# Patient Record
Sex: Male | Born: 1965 | Race: White | Hispanic: No | Marital: Single | Smoking: Current every day smoker
Health system: Southern US, Community
[De-identification: ages and names within clinical notes are randomized; demographics above are authoritative.]

## PROBLEM LIST (undated history)

## (undated) DIAGNOSIS — K469 Unspecified abdominal hernia without obstruction or gangrene: Secondary | ICD-10-CM

## (undated) DIAGNOSIS — K279 Peptic ulcer, site unspecified, unspecified as acute or chronic, without hemorrhage or perforation: Secondary | ICD-10-CM

---

## 2009-09-05 ENCOUNTER — Emergency Department (HOSPITAL_COMMUNITY): Admission: EM | Admit: 2009-09-05 | Discharge: 2009-09-05 | Payer: Self-pay | Admitting: Emergency Medicine

## 2011-07-08 ENCOUNTER — Emergency Department (HOSPITAL_COMMUNITY): Payer: Self-pay

## 2011-07-08 ENCOUNTER — Emergency Department (HOSPITAL_COMMUNITY)
Admission: EM | Admit: 2011-07-08 | Discharge: 2011-07-09 | Disposition: A | Discharge status: Home / Self Care | Payer: Self-pay | Attending: Emergency Medicine | Admitting: Emergency Medicine

## 2011-07-08 DIAGNOSIS — F17200 Nicotine dependence, unspecified, uncomplicated: Secondary | ICD-10-CM | POA: Insufficient documentation

## 2011-07-08 DIAGNOSIS — R110 Nausea: Secondary | ICD-10-CM | POA: Insufficient documentation

## 2011-07-08 DIAGNOSIS — R8281 Pyuria: Secondary | ICD-10-CM

## 2011-07-08 DIAGNOSIS — R82998 Other abnormal findings in urine: Secondary | ICD-10-CM | POA: Insufficient documentation

## 2011-07-08 DIAGNOSIS — R109 Unspecified abdominal pain: Secondary | ICD-10-CM | POA: Insufficient documentation

## 2011-07-08 DIAGNOSIS — R319 Hematuria, unspecified: Secondary | ICD-10-CM | POA: Insufficient documentation

## 2011-07-08 DIAGNOSIS — R300 Dysuria: Secondary | ICD-10-CM | POA: Insufficient documentation

## 2011-07-08 HISTORY — DX: Unspecified abdominal hernia without obstruction or gangrene: K46.9

## 2011-07-08 HISTORY — DX: Peptic ulcer, site unspecified, unspecified as acute or chronic, without hemorrhage or perforation: K27.9

## 2011-07-08 LAB — URINE MICROSCOPIC-ADD ON

## 2011-07-08 LAB — URINALYSIS, ROUTINE W REFLEX MICROSCOPIC
Leukocytes, UA: NEGATIVE
Nitrite: NEGATIVE
Protein, ur: 30 mg/dL — AB

## 2011-07-08 LAB — BASIC METABOLIC PANEL
CO2: 28 mEq/L (ref 19–32)
Calcium: 9.3 mg/dL (ref 8.4–10.5)
GFR calc non Af Amer: >90 mL/min (ref >90)
Glucose, Bld: 120 mg/dL — ABNORMAL HIGH (ref 70–99)
Potassium: 3.3 mEq/L — ABNORMAL LOW (ref 3.5–5.1)
Sodium: 136 mEq/L (ref 135–145)

## 2011-07-08 MED ORDER — SODIUM CHLORIDE 0.9 % IV SOLN: INTRAVENOUS | Status: DC

## 2011-07-08 MED ORDER — ONDANSETRON HCL 4 MG/2ML IJ SOLN
4.0000 mg | Freq: Once | INTRAMUSCULAR | Status: AC
  Administered 2011-07-08: 4 mg via INTRAVENOUS
  Filled 2011-07-08: qty 2

## 2011-07-08 MED ORDER — HYDROMORPHONE HCL PF 1 MG/ML IJ SOLN
1.0000 mg | Freq: Once | INTRAMUSCULAR | Status: AC
  Administered 2011-07-08: 1 mg via INTRAVENOUS
  Filled 2011-07-08: qty 1

## 2011-07-08 NOTE — ED Notes (Signed)
Pt presents with bilateral flank pain, hematuria and pain with urination. Pt states he also has nausea when "sharp pains come".

## 2011-07-08 NOTE — ED Notes (Signed)
Pt states has had kidney stone in the past (5 yrs ago) and is same type pain as then.  Pt co of right flank pain that radiates into rt groin. Pt states has had blood in urine today. Unable to void at this time. Pt aware of need for specimen.

## 2011-07-09 MED ORDER — DOXYCYCLINE HYCLATE 100 MG PO TABS: 100.0000 mg | ORAL_TABLET | Freq: Once | ORAL | Status: DC

## 2011-07-09 MED ORDER — CIPROFLOXACIN HCL 250 MG PO TABS
500.0000 mg | ORAL_TABLET | Freq: Once | ORAL | Status: AC
  Administered 2011-07-09: 500 mg via ORAL
  Filled 2011-07-09: qty 2

## 2011-07-09 MED ORDER — OXYCODONE-ACETAMINOPHEN 5-325 MG PO TABS: 1.0000 | ORAL_TABLET | ORAL | Status: AC | PRN

## 2011-07-09 MED ORDER — CIPROFLOXACIN HCL 500 MG PO TABS: 500.0000 mg | ORAL_TABLET | Freq: Two times a day (BID) | ORAL | Status: AC

## 2011-07-09 MED ORDER — OXYCODONE-ACETAMINOPHEN 5-325 MG PO TABS
1.0000 | ORAL_TABLET | Freq: Once | ORAL | Status: AC
  Administered 2011-07-09: 1 via ORAL
  Filled 2011-07-09: qty 1

## 2011-07-09 NOTE — ED Notes (Signed)
Pt given discharge instructions, paperwork & prescription(s), pt verbalized understanding.   

## 2011-07-10 NOTE — ED Provider Notes (Signed)
History     CSN: [id number] Arrival date & time: 07/08/2011  8:36 PM   First MD Initiated Contact with Patient 07/08/11 2202      Chief Complaint  Patient presents with  . Flank Pain  . Hematuria  . Dysuria    (Consider location/radiation/quality/duration/timing/severity/associated sxs/prior treatment) Patient is a 45 y.o. male presenting with flank pain, hematuria, and dysuria. The history is provided by the patient and the spouse.  Flank Pain This is a new problem. The current episode started yesterday. The problem occurs intermittently. The problem has been unchanged. Associated symptoms include nausea. Pertinent negatives include no abdominal pain, arthralgias, chest pain, congestion, diaphoresis, fever, headaches, joint swelling, neck pain, numbness, rash, sore throat, vomiting or weakness. Associated symptoms comments: Bilateral flank pain and hematuria.  Denies penile discharge. . Exacerbated by: urination. He has tried acetaminophen and NSAIDs for the symptoms. The treatment provided no relief.  Hematuria Associated symptoms include dysuria, flank pain and nausea. Pertinent negatives include no abdominal pain, fever or vomiting.  Dysuria  Associated symptoms include nausea, hematuria and flank pain. Pertinent negatives include no vomiting.    Past Medical History  Diagnosis Date  . Hernia   . Peptic ulcer     History reviewed. No pertinent past surgical history.  No family history on file.  History  Substance Use Topics  . Smoking status: Current Everyday Smoker -- 2.0 packs/day    Types: Cigarettes  . Smokeless tobacco: Not on file  . Alcohol Use: No      Review of Systems  Constitutional: Negative for fever and diaphoresis.  HENT: Negative for congestion, sore throat and neck pain.   Eyes: Negative.   Respiratory: Negative for chest tightness and shortness of breath.   Cardiovascular: Negative for chest pain.  Gastrointestinal: Positive for nausea.  Negative for vomiting and abdominal pain.  Genitourinary: Positive for dysuria, hematuria and flank pain.  Musculoskeletal: Negative for joint swelling and arthralgias.  Skin: Negative.  Negative for rash and wound.  Neurological: Negative for dizziness, weakness, light-headedness, numbness and headaches.  Hematological: Negative.   Psychiatric/Behavioral: Negative.     Allergies  Review of patient's allergies indicates no known allergies.  Home Medications   Current Outpatient Rx  Name Route Sig Dispense Refill  . CIPROFLOXACIN HCL 500 MG PO TABS Oral Take 1 tablet (500 mg total) by mouth 2 (two) times daily. 14 tablet 0  . OXYCODONE-ACETAMINOPHEN 5-325 MG PO TABS Oral Take 1 tablet by mouth every 4 (four) hours as needed for pain. 20 tablet 0    BP 106/60  Pulse 63  Temp(Src) 98.3 F (36.8 C) (Oral)  Resp 26  Ht 5\' 7"  (1.702 m)  Wt 160 lb (72.576 kg)  BMI 25.06 kg/m2  SpO2 96%  Physical Exam  Nursing note and vitals reviewed. Constitutional: He is oriented to person, place, and time. He appears well-developed and well-nourished.  HENT:  Head: Normocephalic and atraumatic.  Eyes: Conjunctivae are normal.  Neck: Normal range of motion.  Cardiovascular: Normal rate, regular rhythm, normal heart sounds and intact distal pulses.   Pulmonary/Chest: Effort normal and breath sounds normal. He has no wheezes.  Abdominal: Soft. Bowel sounds are normal. There is no tenderness. There is CVA tenderness.       CVA tenderness,  Right greater than left.  Musculoskeletal: Normal range of motion.  Neurological: He is alert and oriented to person, place, and time.  Skin: Skin is warm and dry.  Psychiatric: He has a normal mood  and affect.    ED Course  Procedures (including critical care time)  Labs Reviewed  URINALYSIS, ROUTINE W REFLEX MICROSCOPIC - Abnormal; Notable for the following:    Specific Gravity, Urine >1.030 (*)    Bilirubin Urine SMALL (*)    Ketones, ur TRACE (*)      Protein, ur 30 (*)    All other components within normal limits  BASIC METABOLIC PANEL - Abnormal; Notable for the following:    Potassium 3.3 (*)    Glucose, Bld 120 (*)    All other components within normal limits  URINE MICROSCOPIC-ADD ON  URINE CULTURE   Ct Abdomen Pelvis Wo Contrast  07/09/2011  *RADIOLOGY REPORT*  Clinical Data: Right flank pain; hematuria and dysuria.  History of renal stone.  CT ABDOMEN AND PELVIS WITHOUT CONTRAST  Technique:  Multidetector CT imaging of the abdomen and pelvis was performed following the standard protocol without intravenous contrast.  Comparison: None.  Findings: The visualized lung bases are clear.  The liver and spleen are unremarkable in appearance.  The gallbladder is within normal limits.  The pancreas and adrenal glands are unremarkable.  The kidneys are unremarkable in appearance.  There is no evidence of hydronephrosis.  No renal or ureteral stones are seen.  No perinephric stranding is appreciated.  No free fluid is identified.  The small bowel is unremarkable in appearance.  The stomach is within normal limits.  No acute vascular abnormalities are seen.  Scattered calcification is noted along the abdominal aorta and its branches, mildly advanced for age.  The appendix appears grossly normal in caliber, without evidence for appendicitis.  The colon is largely decompressed and grossly unremarkable in appearance.  The bladder is mildly distended and appears grossly unremarkable. The prostate remains normal in size, with minimal calcification. No inguinal lymphadenopathy is seen.  No acute osseous abnormalities are identified.  IMPRESSION:  1.  No evidence of hydronephrosis; no acute abnormalities identified within the abdomen or pelvis. 2.  Scattered calcification along the abdominal aorta and its branches, mildly advanced for age.  Original Report Authenticated By: Tonia Ghent, M.D.     1. Pyuria   2. Flank pain       MDM  Suspect possible  passed ureteral stone given persistent right sided pain.  No pyelo per Ct scan.  Uti.        Candis Musa, PA 07/10/11 2050

## 2011-07-11 LAB — URINE CULTURE: Culture  Setup Time: 201212022017

## 2011-07-11 NOTE — ED Provider Notes (Signed)
Medical screening examination/treatment/procedure(s) were performed by non-physician practitioner and as supervising physician I was immediately available for consultation/collaboration.   Shelda Jakes, MD 07/11/11 2034

## 2021-06-13 ENCOUNTER — Other Ambulatory Visit: Payer: Self-pay

## 2021-06-13 ENCOUNTER — Emergency Department (HOSPITAL_COMMUNITY): Payer: Medicaid - Out of State

## 2021-06-13 ENCOUNTER — Emergency Department (HOSPITAL_COMMUNITY)
Admission: EM | Admit: 2021-06-13 | Discharge: 2021-06-13 | Disposition: A | Discharge status: Home / Self Care | Payer: Medicaid - Out of State | Attending: Emergency Medicine | Admitting: Emergency Medicine

## 2021-06-13 DIAGNOSIS — E876 Hypokalemia: Secondary | ICD-10-CM | POA: Diagnosis not present

## 2021-06-13 DIAGNOSIS — R4182 Altered mental status, unspecified: Secondary | ICD-10-CM | POA: Diagnosis present

## 2021-06-13 DIAGNOSIS — N611 Abscess of the breast and nipple: Secondary | ICD-10-CM | POA: Insufficient documentation

## 2021-06-13 DIAGNOSIS — F17210 Nicotine dependence, cigarettes, uncomplicated: Secondary | ICD-10-CM | POA: Insufficient documentation

## 2021-06-13 DIAGNOSIS — F1990 Other psychoactive substance use, unspecified, uncomplicated: Secondary | ICD-10-CM | POA: Insufficient documentation

## 2021-06-13 DIAGNOSIS — L0291 Cutaneous abscess, unspecified: Secondary | ICD-10-CM

## 2021-06-13 LAB — COMPREHENSIVE METABOLIC PANEL
ALT: 89 U/L — ABNORMAL HIGH (ref 0–44)
AST: 108 U/L — ABNORMAL HIGH (ref 15–41)
Albumin: 3 g/dL — ABNORMAL LOW (ref 3.5–5.0)
Alkaline Phosphatase: 112 U/L (ref 38–126)
Anion gap: 5 (ref 5–15)
BUN: 10 mg/dL (ref 6–20)
CO2: 28 mmol/L (ref 22–32)
Calcium: 8.3 mg/dL — ABNORMAL LOW (ref 8.9–10.3)
Chloride: 105 mmol/L (ref 98–111)
Creatinine, Ser: 0.83 mg/dL (ref 0.61–1.24)
GFR, Estimated: >60 mL/min (ref >60)
Glucose, Bld: 83 mg/dL (ref 70–99)
Potassium: 2.9 mmol/L — ABNORMAL LOW (ref 3.5–5.1)
Sodium: 138 mmol/L (ref 135–145)
Total Bilirubin: 1.4 mg/dL — ABNORMAL HIGH (ref 0.3–1.2)
Total Protein: 7.6 g/dL (ref 6.5–8.1)

## 2021-06-13 LAB — CBC WITH DIFFERENTIAL/PLATELET
Abs Immature Granulocytes: 0 10*3/uL (ref 0.00–0.07)
Basophils Absolute: 0 10*3/uL (ref 0.0–0.1)
Basophils Relative: 1 %
Eosinophils Absolute: 0.2 10*3/uL (ref 0.0–0.5)
Eosinophils Relative: 4 %
HCT: 39.1 % (ref 39.0–52.0)
Hemoglobin: 12.6 g/dL — ABNORMAL LOW (ref 13.0–17.0)
Immature Granulocytes: 0 %
Lymphocytes Relative: 28 %
Lymphs Abs: 1.2 10*3/uL (ref 0.7–4.0)
MCH: 31.5 pg (ref 26.0–34.0)
MCHC: 32.2 g/dL (ref 30.0–36.0)
MCV: 97.8 fL (ref 80.0–100.0)
Monocytes Absolute: 0.4 10*3/uL (ref 0.1–1.0)
Monocytes Relative: 10 %
Neutro Abs: 2.5 10*3/uL (ref 1.7–7.7)
Neutrophils Relative %: 57 %
Platelets: 71 10*3/uL — ABNORMAL LOW (ref 150–400)
RBC: 4 MIL/uL — ABNORMAL LOW (ref 4.22–5.81)
RDW: 15 % (ref 11.5–15.5)
WBC: 4.3 10*3/uL (ref 4.0–10.5)
nRBC: 0 % (ref 0.0–0.2)

## 2021-06-13 LAB — TROPONIN I (HIGH SENSITIVITY): Troponin I (High Sensitivity): 5 ng/L (ref <18)

## 2021-06-13 LAB — RAPID URINE DRUG SCREEN, HOSP PERFORMED
Amphetamines: POSITIVE — AB
Barbiturates: NOT DETECTED
Benzodiazepines: NOT DETECTED
Cocaine: NOT DETECTED
Opiates: NOT DETECTED
Tetrahydrocannabinol: POSITIVE — AB

## 2021-06-13 LAB — ETHANOL: Alcohol, Ethyl (B): <10 mg/dL (ref <10)

## 2021-06-13 LAB — CBG MONITORING, ED: Glucose-Capillary: 98 mg/dL (ref 70–99)

## 2021-06-13 MED ORDER — SODIUM CHLORIDE 0.9 % IV BOLUS
500.0000 mL | Freq: Once | INTRAVENOUS | Status: AC
  Administered 2021-06-13: 500 mL via INTRAVENOUS

## 2021-06-13 MED ORDER — POTASSIUM CHLORIDE CRYS ER 20 MEQ PO TBCR: 20.0000 meq | EXTENDED_RELEASE_TABLET | Freq: Two times a day (BID) | ORAL | 0 refills | Status: AC

## 2021-06-13 MED ORDER — SULFAMETHOXAZOLE-TRIMETHOPRIM 800-160 MG PO TABS
1.0000 | ORAL_TABLET | Freq: Two times a day (BID) | ORAL | 0 refills | Status: AC
Start: 2021-06-13 — End: 2021-06-20

## 2021-06-13 MED ORDER — POTASSIUM CHLORIDE CRYS ER 20 MEQ PO TBCR
40.0000 meq | EXTENDED_RELEASE_TABLET | Freq: Once | ORAL | Status: AC
  Administered 2021-06-13: 40 meq via ORAL
  Filled 2021-06-13: qty 2

## 2021-06-13 NOTE — ED Notes (Signed)
Pt able to ambulate independently, successfully PO challenged

## 2021-06-13 NOTE — Discharge Instructions (Addendum)
You have been seen and evaluated in the emergency department.  Your work-up shows that you tested positive for marijuana and for amphetamine.  Your potassium level today was also low.  You have been prescribed potassium and you will need to have your level rechecked in 1 to 2 weeks.  Also, you have been.  Prescription for an antibiotic for your likely abscess.  Please take the antibiotic as directed until its finished.  Follow-up with your primary care provider or return to the emergency department for any new or worsening symptoms.

## 2021-06-13 NOTE — ED Notes (Signed)
Pt transported to CT ?
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
